# Patient Record
Sex: Male | Born: 1980 | Race: Black or African American | Hispanic: No | Marital: Single | Smoking: Current every day smoker
Health system: Southern US, Community
[De-identification: ages and names within clinical notes are randomized; demographics above are authoritative.]

---

## 2007-01-17 ENCOUNTER — Emergency Department: Payer: Self-pay | Admitting: Unknown Physician Specialty

## 2010-04-25 ENCOUNTER — Emergency Department: Payer: Self-pay | Admitting: Emergency Medicine

## 2010-08-05 DEATH — deceased

## 2011-07-26 ENCOUNTER — Emergency Department: Payer: Self-pay | Admitting: Unknown Physician Specialty

## 2013-10-28 ENCOUNTER — Emergency Department: Payer: Self-pay | Admitting: Emergency Medicine

## 2014-03-17 ENCOUNTER — Observation Stay: Payer: Self-pay | Admitting: Student

## 2014-03-17 LAB — URINALYSIS, COMPLETE
BACTERIA: NONE SEEN
Bilirubin,UR: NEGATIVE
GLUCOSE, UR: NEGATIVE mg/dL (ref 0–75)
Ketone: NEGATIVE
NITRITE: NEGATIVE
Ph: 6 (ref 4.5–8.0)
Protein: 100
RBC,UR: 6 /HPF (ref 0–5)
SPECIFIC GRAVITY: 1.028 (ref 1.003–1.030)
WBC UR: 24 /HPF (ref 0–5)

## 2014-03-17 LAB — CBC WITH DIFFERENTIAL/PLATELET
BASOS PCT: 0.6 %
Basophil #: 0.1 10*3/uL (ref 0.0–0.1)
EOS ABS: 0 10*3/uL (ref 0.0–0.7)
Eosinophil %: 0.1 %
HCT: 47.1 % (ref 40.0–52.0)
HGB: 15.6 g/dL (ref 13.0–18.0)
Lymphocyte #: 1.1 10*3/uL (ref 1.0–3.6)
Lymphocyte %: 7.1 %
MCH: 27.2 pg (ref 26.0–34.0)
MCHC: 33 g/dL (ref 32.0–36.0)
MCV: 82 fL (ref 80–100)
MONO ABS: 0.6 x10 3/mm (ref 0.2–1.0)
Monocyte %: 3.8 %
NEUTROS PCT: 88.4 %
Neutrophil #: 14.3 10*3/uL — ABNORMAL HIGH (ref 1.4–6.5)
Platelet: 190 10*3/uL (ref 150–440)
RBC: 5.72 10*6/uL (ref 4.40–5.90)
RDW: 14 % (ref 11.5–14.5)
WBC: 16.1 10*3/uL — ABNORMAL HIGH (ref 3.8–10.6)

## 2014-03-17 LAB — COMPREHENSIVE METABOLIC PANEL
ALT: 15 U/L (ref 12–78)
Albumin: 3.2 g/dL — ABNORMAL LOW (ref 3.4–5.0)
Alkaline Phosphatase: 42 U/L — ABNORMAL LOW
Anion Gap: 7 (ref 7–16)
BILIRUBIN TOTAL: 0.4 mg/dL (ref 0.2–1.0)
BUN: 8 mg/dL (ref 7–18)
CHLORIDE: 96 mmol/L — AB (ref 98–107)
CO2: 28 mmol/L (ref 21–32)
Calcium, Total: 9 mg/dL (ref 8.5–10.1)
Creatinine: 1.06 mg/dL (ref 0.60–1.30)
EGFR (African American): >60
EGFR (Non-African Amer.): >60
Glucose: 124 mg/dL — ABNORMAL HIGH (ref 65–99)
OSMOLALITY: 262 (ref 275–301)
POTASSIUM: 3.7 mmol/L (ref 3.5–5.1)
SGOT(AST): 14 U/L — ABNORMAL LOW (ref 15–37)
SODIUM: 131 mmol/L — AB (ref 136–145)
Total Protein: 8.1 g/dL (ref 6.4–8.2)

## 2014-03-18 LAB — CBC WITH DIFFERENTIAL/PLATELET
Basophil #: 0 10*3/uL (ref 0.0–0.1)
Basophil %: 0.1 %
EOS PCT: 0 %
Eosinophil #: 0 10*3/uL (ref 0.0–0.7)
HCT: 44 % (ref 40.0–52.0)
HGB: 15 g/dL (ref 13.0–18.0)
LYMPHS PCT: 2.1 %
Lymphocyte #: 0.3 10*3/uL — ABNORMAL LOW (ref 1.0–3.6)
MCH: 27.8 pg (ref 26.0–34.0)
MCHC: 34.2 g/dL (ref 32.0–36.0)
MCV: 81 fL (ref 80–100)
Monocyte #: 0.2 x10 3/mm (ref 0.2–1.0)
Monocyte %: 1.7 %
Neutrophil #: 14.1 10*3/uL — ABNORMAL HIGH (ref 1.4–6.5)
Neutrophil %: 96.1 %
Platelet: 197 10*3/uL (ref 150–440)
RBC: 5.41 10*6/uL (ref 4.40–5.90)
RDW: 14.2 % (ref 11.5–14.5)
WBC: 14.6 10*3/uL — AB (ref 3.8–10.6)

## 2014-03-18 LAB — BASIC METABOLIC PANEL
ANION GAP: 7 (ref 7–16)
BUN: 8 mg/dL (ref 7–18)
CALCIUM: 8.3 mg/dL — AB (ref 8.5–10.1)
Chloride: 101 mmol/L (ref 98–107)
Co2: 24 mmol/L (ref 21–32)
Creatinine: 1.08 mg/dL (ref 0.60–1.30)
EGFR (African American): >60
Glucose: 153 mg/dL — ABNORMAL HIGH (ref 65–99)
Osmolality: 266 (ref 275–301)
POTASSIUM: 4 mmol/L (ref 3.5–5.1)
Sodium: 132 mmol/L — ABNORMAL LOW (ref 136–145)

## 2014-03-22 LAB — CULTURE, BLOOD (SINGLE)

## 2015-01-26 NOTE — H&P (Signed)
PATIENT NAME:  Aaron Noble, Aaron Noble MR#:  [id number] DATE OF BIRTH:  September 06, 1981  DATE OF ADMISSION:  03/17/2014  PRIMARY CARE PHYSICIAN: None.   REFERRING PHYSICIAN: Dr. Lowella FairyJohn Woodruff.   CHIEF COMPLAINT: Back pain.   HISTORY OF PRESENT ILLNESS: Mr. Aaron Noble is a 34 year old male with a history of obesity, continued tobacco use, presented to the Emergency Department with complaints of back pain. The patient has been experiencing mild cough with no productive sputum. Denies having any shortness of breath. The patient has been having a fever of 101.   WORKUP IN THE EMERGENCY DEPARTMENT: Chest x-ray shows left lower lobe pneumonia. The patient received Rocephin and Zithromax in the Emergency Department. The patient is also found to have elevated white blood cell count of 16,000 with a left shift of 88%. The patient has mild hyponatremia of 131. The patient also has urinary tract infection with 1+ leukocyte esterase and WBC of 24. The patient states continues to smoke tobacco, uses cocaine on a regular basis, last use was 2 days back.   PAST MEDICAL HISTORY: None.   PAST SURGICAL HISTORY: None.   ALLERGIES: No known drug allergies.   HOME MEDICATIONS: Ibuprofen 400 mg every 6 hours as needed.   SOCIAL HISTORY:  1. Continues to smoke 10 cigarettes a day.  2. Drinks 2 beers daily.  3. Uses cocaine on a regular basis.  4. Works as a [employer].   FAMILY HISTORY: Mother had diabetes mellitus and strokes, died in her 2950s.   REVIEW OF SYSTEMS:  CONSTITUTIONAL: Experiencing generalized weakness.  EYES: No change in vision.  ENT: No change in hearing.  RESPIRATORY: Has mild shortness of breath.  CARDIOVASCULAR: No chest pain, palpations.  GASTROINTESTINAL: No nausea, vomiting, abdominal pain.  GENITOURINARY: No dysuria or hematuria.  SKIN: No rash or lesions.  MUSCULOSKELETAL: Has back pain.  NEUROLOGIC: No weakness or numbness in any part of the body.   PHYSICAL EXAMINATION:  GENERAL: This is a  little bit well-built, well-nourished, obese male lying down in the bed, not in distress.  VITAL SIGNS: Temperature 101.2, pulse 104, blood pressure 129/72, respiratory rate of 18, oxygen saturation 97% on room air.  HEENT: Head normocephalic, atraumatic. There is no scleral icterus. Conjunctivae normal. Pupils equal and react to light. Extraocular movements are intact. Mucous membranes moist. No pharyngeal erythema.  NECK: Supple. No lymphadenopathy. No JVD. No carotid bruit. No thyromegaly.  CHEST: Has no focal tenderness, bilateral coarse breath sounds.  HEART: S1, S2, regular, tachycardia.  ABDOMEN: Bowel sounds present. Soft, nontender, nondistended. Obese abdomen. Could not appreciate any hepatosplenomegaly.  EXTREMITIES: No pedal edema. Pulses 2+.  SKIN: No rash or lesions.  MUSCULOSKELETAL: Good range of motion in all the extremities.  NEUROLOGIC: The patient is alert, oriented to place, person, and time. Cranial nerves II-XII intact. Motor 5/5 in upper and lower extremities.   LABORATORY DATA: CBC, WBC of 16,000, hemoglobin 15.6, platelet count of 190,000, with a left shift of 88%. CMP: BUN 8, creatinine of 1.06, sodium 131. Urinalysis 1+ leukocyte esterase, WBC of 24. The rest of the values are within normal limits.   Chest x-ray PA and lateral: Left lower lobe pneumonia.   ASSESSMENT AND PLAN: Mr. Aaron Noble is a 34 year old male who comes with left lower lobe pneumonia. We are going to treat it as a community-acquired pneumonia with Rocephin and Zithromax. Follow up with blood and sputum cultures. 1. Urinary tract infection. Obtain urine cultures. Continue with Rocephin.  2. Sepsis secondary to both pneumonia and  the urinary tract infection. Continue with the current antibiotic regimen.  3. Tobacco use. Counseled with the patient. Patient expressed understanding.  4. Continued alcohol use. Keep the patient on thiamine and a Foley cath in. Will have a close followup if the patient develops  any signs of alcohol withdrawal.  5. Obesity. Counseled with the patient regarding diet and exercise. Expressed understanding.  6. Keep the patient on deep vein thrombosis prophylaxis with Lovenox.   TIME SPENT: 50 minutes.    ____________________________ Susa Griffins, MD pv:lt D: 03/17/2014 23:28:27 ET T: 03/18/2014 02:45:00 ET JOB#: [id number]  cc: Susa Griffins, MD, <Dictator> Susa Griffins MD ELECTRONICALLY SIGNED 03/21/2014 7:20

## 2015-01-26 NOTE — Discharge Summary (Signed)
PATIENT NAME:  Aaron Noble, Aaron Noble MR#:  [id number] DATE OF BIRTH:  Oct 01, 1981  DATE OF ADMISSION:  03/17/2014 DATE OF DISCHARGE:  03/19/2014  PRIMARY CARE PHYSICIAN: None.  CHIEF COMPLAINT: Back pain.   DISCHARGE DIAGNOSES:  1. Sepsis due to pneumonia and possible urinary tract infection.  2. Tobacco abuse.  3. Alcohol abuse.  4. Obesity.   DISCHARGE MEDICATIONS: Vantin 200 mg every 12 hours for 6 days.   DIET: Regular.   ACTIVITY: As tolerated.   FOLLOWUP: Please follow with PCP within 1- 2 weeks. If shortness of breath, cough, wheezing worsens or you have recurrent fevers, follow with your doctor right away.   DISPOSITION: Home.   SIGNIFICANT LABORATORIES AND IMAGING: Initial white count of 16.1. Blood cultures from June 13th: No growth to date. Urinalysis on arrival showed 1+ leukocyte esterase, 6 RBCs, 24 WBC. No bacteria. X-ray of the chest, PA and lateral: Left lower lobe pneumonia.   HISTORY OF PRESENT ILLNESS AND HOSPITAL COURSE: For full details of H and P, please see the dictation on June 13th by Dr. Heron NayVasireddy but briefly, this is a 34 year old male with tobacco abuse, who came in with fevers and leukocytosis. He was admitted to the hospitalist service, started on ceftriaxone and azithromycin for sepsis, which was deemed to be secondary to possible UTI as he did have dirty urine as well as pneumonia. He had mild leukocytosis and fever. He did well while in the hospital. He at this point has received a couple of days of IV antibiotics and will be discharged with 6 days of Vantin. Unfortunately, there were no urine cultures sent, but the UA was definitely positive. He was started on some fluids. At this point, his shortness of breath is significantly better. He was also started on some steroids for the wheezing that he had likely secondary to the pneumonia. He is an active smoker and he was counseled against smoking. He was also counseled against drinking alcohol. He did not have  significant withdrawal symptoms from alcohol. He had a mild hyponatremia on admission of 131 but we have no previous recent labs so do not know if this is acute or chronic but following sodium on 06/14 was 132. It is possible this is chronic, but this is not certain. At this point, he is walking in the halls. He has no fever.   PHYSICAL EXAMINATION:  VITAL SIGNS: Today is 98.1 temperature, pulse rate 70, respiratory rate 18, blood pressure 106/67, O2 saturation 98% on room air.  GENERAL: The patient is a well-developed obese male lying in bed, no obvious distress.  HEENT: Normocephalic, atraumatic. Pupils are equal. LUNGS: Patient has mild rales on the right but good air entry without significant wheezing or rhonchi.  CARDIAC: Normal S1 and S2. EXTREMITIES: Legs: No edema.   At this point he will be discharged to outpatient followup.   TIME SPENT: Total time spent is 35 minutes.   CODE STATUS: The patient is full code.     ____________________________ Krystal EatonShayiq Maylea Soria, MD sa:lt D: 03/19/2014 17:47:19 ET T: 03/19/2014 20:31:38 ET JOB#: [id number]  cc: Krystal EatonShayiq Morgin Halls, MD, <Dictator> Krystal EatonSHAYIQ Jaydyn Menon MD ELECTRONICALLY SIGNED 04/03/2014 12:51

## 2016-07-25 ENCOUNTER — Encounter: Payer: Self-pay | Admitting: Emergency Medicine

## 2016-07-25 ENCOUNTER — Emergency Department
Admission: EM | Admit: 2016-07-25 | Discharge: 2016-07-25 | Disposition: A | Discharge status: Home / Self Care | Payer: No Typology Code available for payment source | Attending: Emergency Medicine | Admitting: Emergency Medicine

## 2016-07-25 ENCOUNTER — Emergency Department: Payer: No Typology Code available for payment source

## 2016-07-25 DIAGNOSIS — Y92410 Unspecified street and highway as the place of occurrence of the external cause: Secondary | ICD-10-CM | POA: Insufficient documentation

## 2016-07-25 DIAGNOSIS — Y9389 Activity, other specified: Secondary | ICD-10-CM | POA: Insufficient documentation

## 2016-07-25 DIAGNOSIS — S39012A Strain of muscle, fascia and tendon of lower back, initial encounter: Secondary | ICD-10-CM | POA: Insufficient documentation

## 2016-07-25 DIAGNOSIS — Y999 Unspecified external cause status: Secondary | ICD-10-CM | POA: Insufficient documentation

## 2016-07-25 DIAGNOSIS — S3992XA Unspecified injury of lower back, initial encounter: Secondary | ICD-10-CM | POA: Diagnosis present

## 2016-07-25 MED ORDER — DIAZEPAM 5 MG PO TABS: 5.0000 mg | ORAL_TABLET | Freq: Three times a day (TID) | ORAL | 0 refills | Status: AC | PRN

## 2016-07-25 MED ORDER — IBUPROFEN 800 MG PO TABS: 800.0000 mg | ORAL_TABLET | Freq: Three times a day (TID) | ORAL | 0 refills | Status: AC | PRN
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
## 2021-05-01 ENCOUNTER — Emergency Department: Payer: No Typology Code available for payment source

## 2021-05-01 ENCOUNTER — Emergency Department
Admission: EM | Admit: 2021-05-01 | Discharge: 2021-05-01 | Disposition: A | Discharge status: Home / Self Care | Payer: No Typology Code available for payment source | Attending: Emergency Medicine | Admitting: Emergency Medicine

## 2021-05-01 DIAGNOSIS — M545 Low back pain, unspecified: Secondary | ICD-10-CM | POA: Diagnosis not present

## 2021-05-01 DIAGNOSIS — Y92410 Unspecified street and highway as the place of occurrence of the external cause: Secondary | ICD-10-CM | POA: Insufficient documentation

## 2021-05-01 DIAGNOSIS — F17210 Nicotine dependence, cigarettes, uncomplicated: Secondary | ICD-10-CM | POA: Diagnosis not present

## 2021-05-01 MED ORDER — ACETAMINOPHEN 325 MG PO TABS
650.0000 mg | ORAL_TABLET | Freq: Once | ORAL | Status: AC
  Administered 2021-05-01: 650 mg via ORAL
  Filled 2021-05-01: qty 2

## 2021-05-01 MED ORDER — METHOCARBAMOL 750 MG PO TABS: 750.0000 mg | ORAL_TABLET | Freq: Four times a day (QID) | ORAL | 0 refills | Status: AC | PRN

## 2021-05-01 MED ORDER — METHOCARBAMOL 500 MG PO TABS
750.0000 mg | ORAL_TABLET | Freq: Once | ORAL | Status: AC
  Administered 2021-05-01: 750 mg via ORAL
  Filled 2021-05-01: qty 2

## 2021-05-01 MED ORDER — MELOXICAM 15 MG PO TABS: 15.0000 mg | ORAL_TABLET | Freq: Every day | ORAL | 0 refills | Status: AC

## 2021-05-01 MED ORDER — MELOXICAM 7.5 MG PO TABS
15.0000 mg | ORAL_TABLET | Freq: Once | ORAL | Status: AC
  Administered 2021-05-01: 15 mg via ORAL
  Filled 2021-05-01: qty 2
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
## 2021-10-12 IMAGING — CR DG LUMBAR SPINE 2-3V
1 series · 3 of 3 positions shown · non-contrast
Comparison: Lumbar spine radiograph dated 07/25/2016.

CLINICAL DATA: 40-year-old male with low back pain.

EXAM:
LUMBAR SPINE - 2-3 VIEW

[Series 1: dg lumbar spine 2-3 views · 0.14mm/px · 3 of 3 slices shown]
[im 1/3]
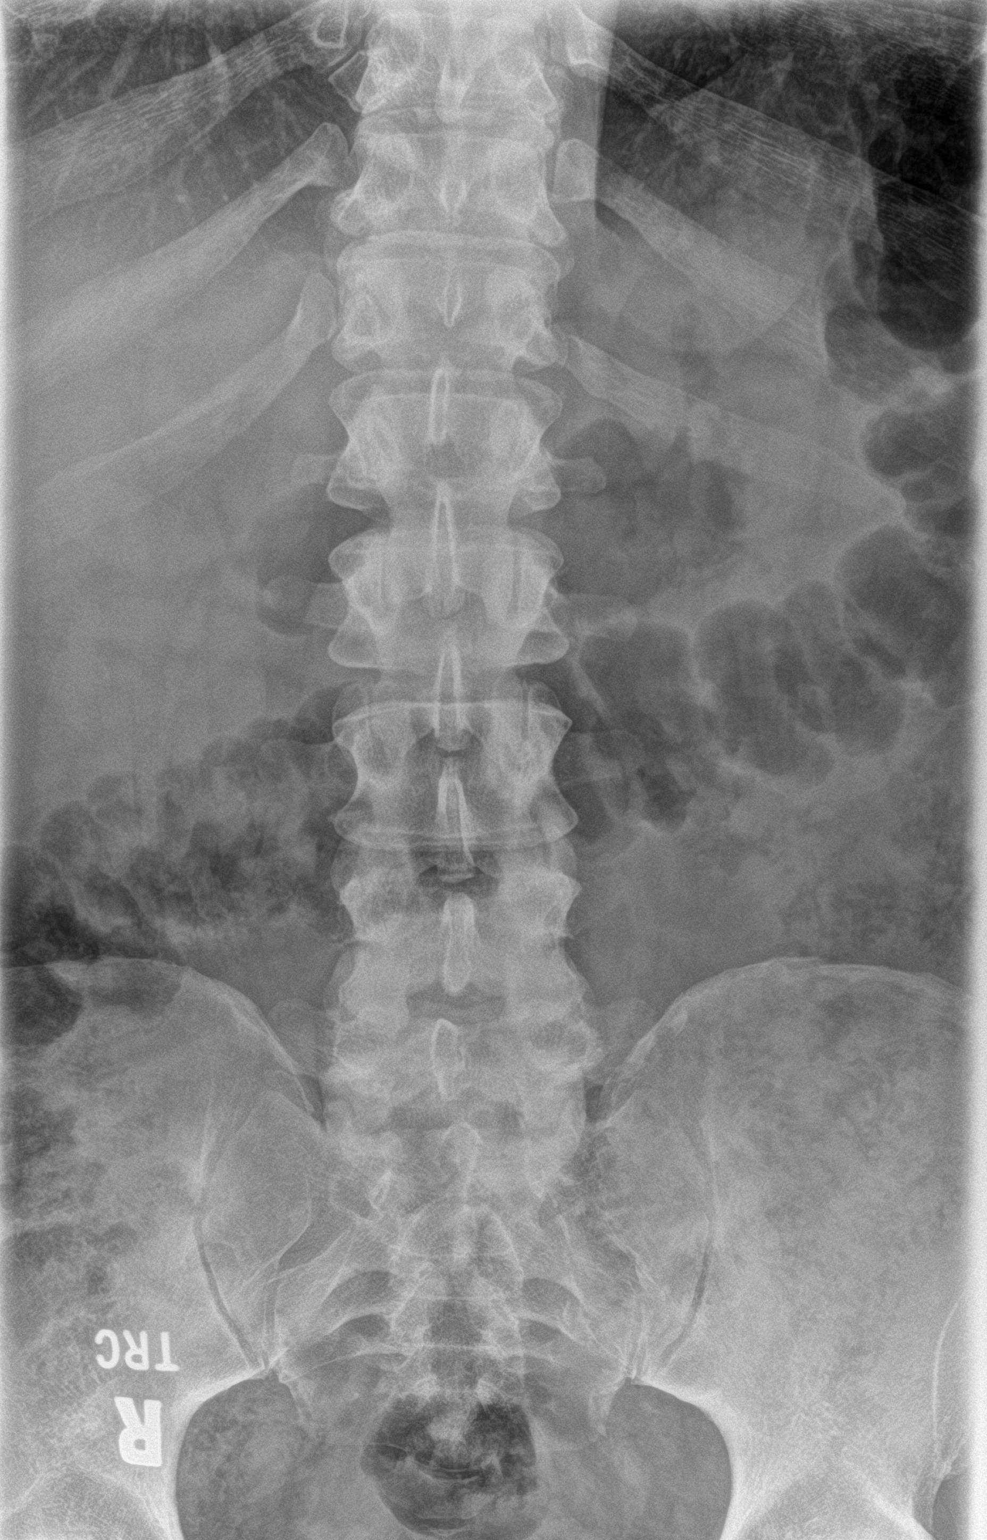
[im 2/3]
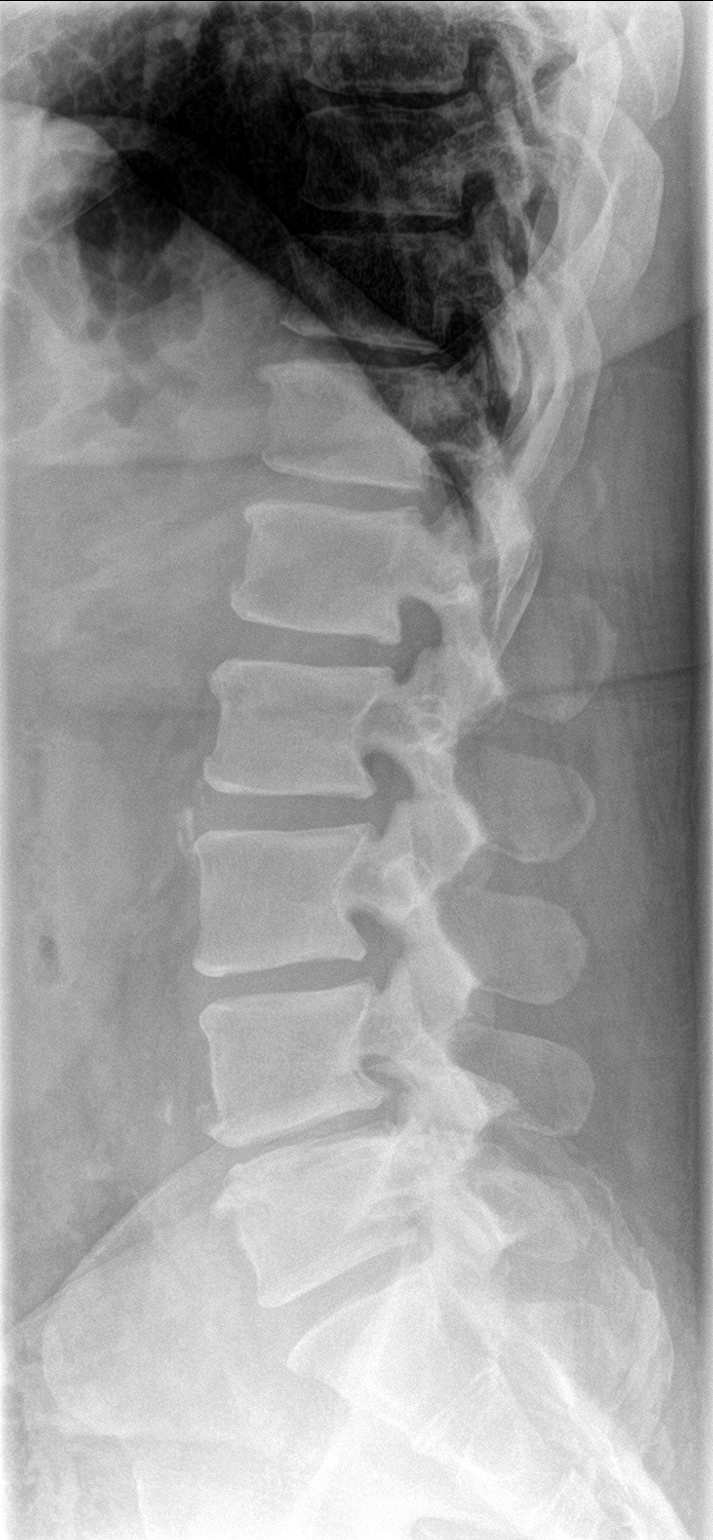
[im 3/3]
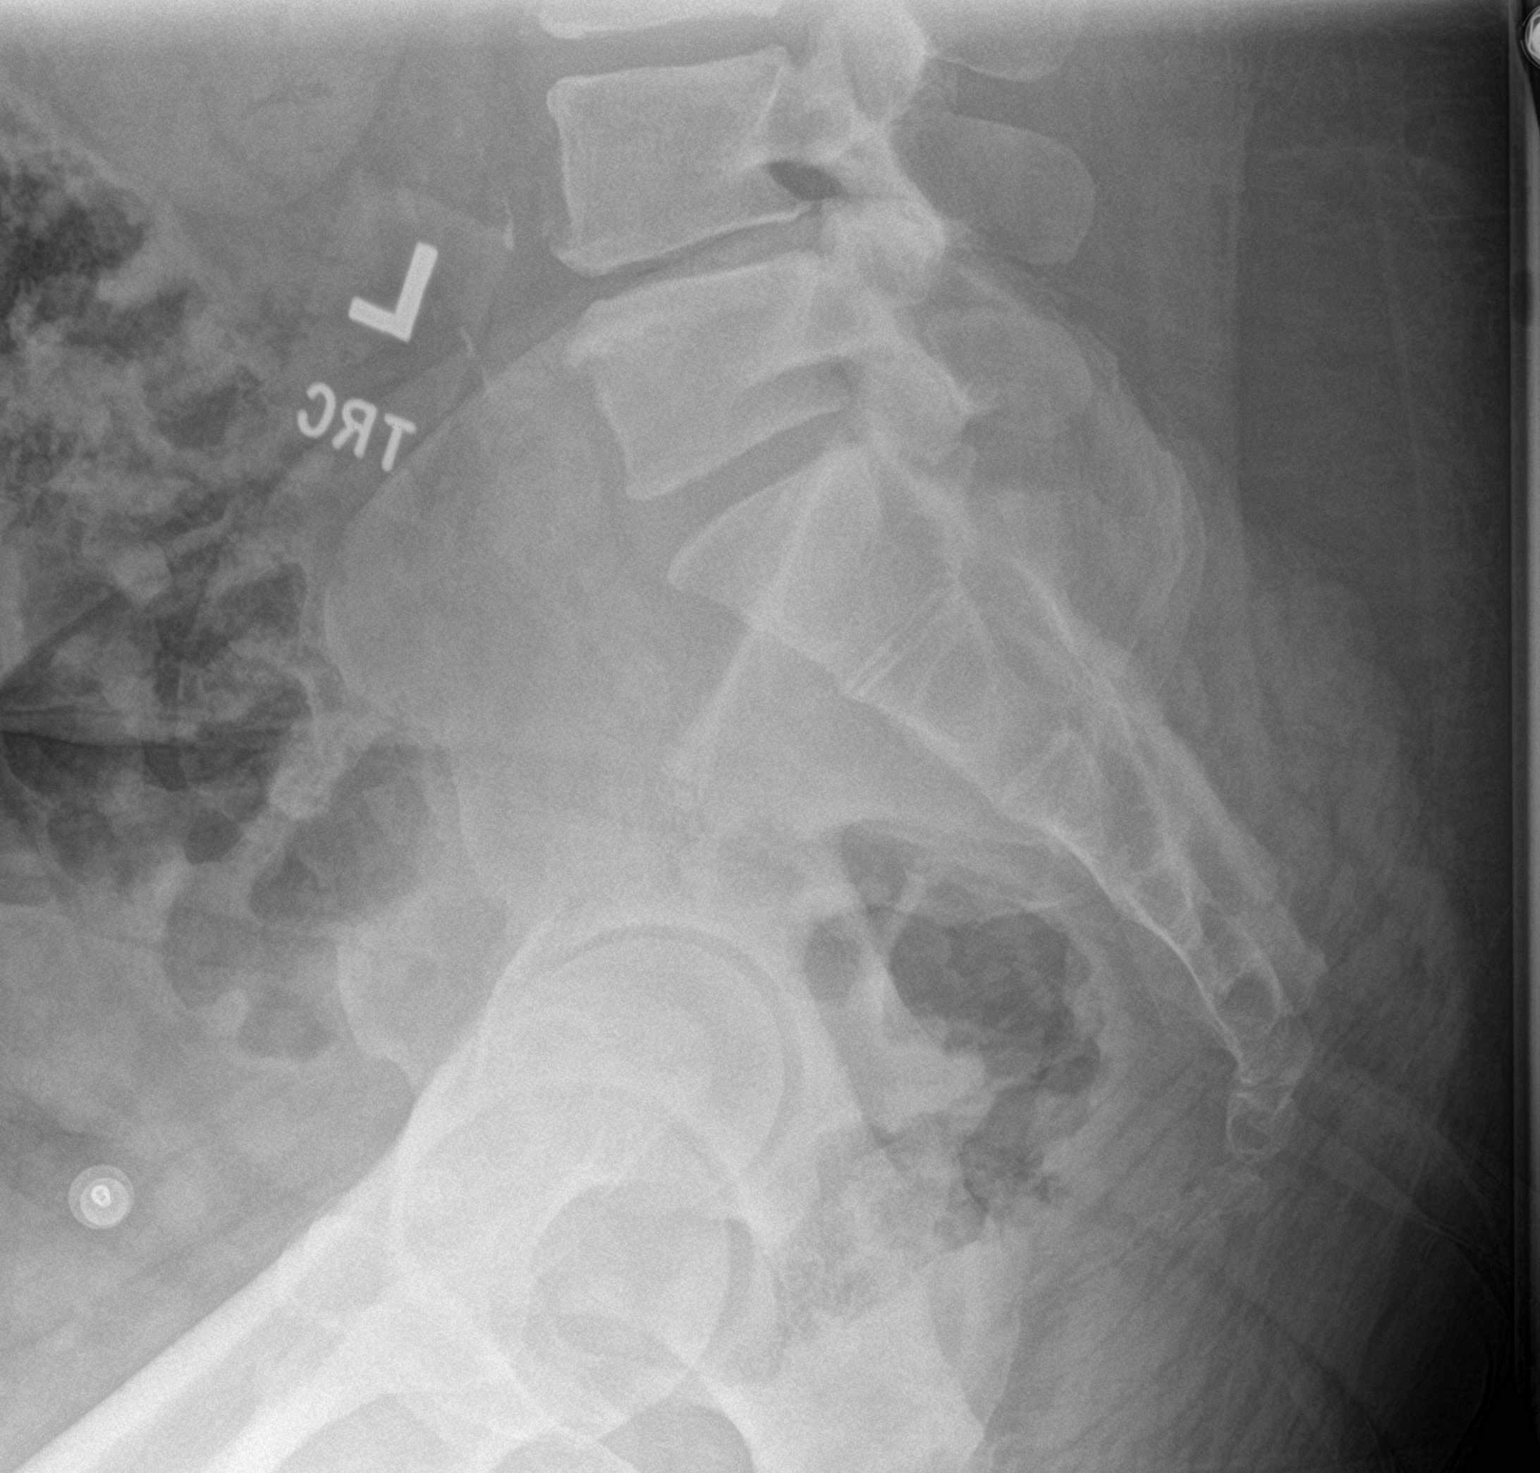

[3 of 3 positions shown; findings below may reference images not displayed]

FINDINGS: Five lumbar type vertebra. There is no acute fracture or subluxation
of the lumbar spine. Multilevel degenerative changes most prominent
at L4-L5 and L5-S1 with disc space narrowing and spurring. Lower
lumbar facet arthropathy. The visualized posterior elements are
intact. The soft tissues are unremarkable. There is mild
atherosclerotic calcification of the abdominal aorta.
IMPRESSION: 1. No acute fracture or subluxation of the lumbar spine.
2. Multilevel degenerative changes.

## 2022-02-10 ENCOUNTER — Other Ambulatory Visit: Payer: Self-pay

## 2022-02-10 ENCOUNTER — Emergency Department: Payer: Self-pay

## 2022-02-10 DIAGNOSIS — S60011A Contusion of right thumb without damage to nail, initial encounter: Secondary | ICD-10-CM | POA: Insufficient documentation

## 2022-02-10 DIAGNOSIS — Y990 Civilian activity done for income or pay: Secondary | ICD-10-CM | POA: Insufficient documentation

## 2022-02-10 DIAGNOSIS — W231XXA Caught, crushed, jammed, or pinched between stationary objects, initial encounter: Secondary | ICD-10-CM | POA: Insufficient documentation
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
## 2022-02-11 ENCOUNTER — Emergency Department
Admission: EM | Admit: 2022-02-11 | Discharge: 2022-02-11 | Disposition: A | Discharge status: Home / Self Care | Payer: Self-pay | Attending: Emergency Medicine | Admitting: Emergency Medicine

## 2022-02-11 DIAGNOSIS — S6010XA Contusion of unspecified finger with damage to nail, initial encounter: Secondary | ICD-10-CM

## 2022-02-11 NOTE — ED Provider Notes (Signed)
? ?[facility] ?Provider Note ? ? ? Event Date/Time  ? First MD Initiated Contact with Patient 02/11/22 0122   ?  (approximate) ? ? ?History  ? ?Extremity Pain ? ? ?HPI ? ?Aaron Noble is a 41 y.o. male who presents to the ED for evaluation of Extremity Pain ? ? ?Accidentally struck his right thumb distally while at work.  Reports this was on Friday, 3 to 4 days ago.  He works Government social research officer.  Increasing blood accumulation beneath the nail of the right thumb.  No further injury since then.  Due to increasing pain he presents to the ED. ? ?Physical Exam  ? ?Triage Vital Signs: ?ED Triage Vitals  ?Enc Vitals Group  ?   BP 02/10/22 2042 138/78  ?   Pulse Rate 02/10/22 2042 (!) 106  ?   Resp 02/10/22 2042 18  ?   Temp 02/10/22 2042 98.7 ?F (37.1 ?C)  ?   Temp Source 02/10/22 2042 Oral  ?   SpO2 02/10/22 2042 99 %  ?   Weight 02/10/22 2043 280 lb (127 kg)  ?   Height 02/10/22 2043 6\' 1"  (1.854 m)  ?   Head Circumference --   ?   Peak Flow --   ?   Pain Score 02/10/22 2043 5  ?   Pain Loc --   ?   Pain Edu? --   ?   Excl. in GC? --   ? ? ?Most recent vital signs: ?Vitals:  ? 02/10/22 2042  ?BP: 138/78  ?Pulse: (!) 106  ?Resp: 18  ?Temp: 98.7 ?F (37.1 ?C)  ?SpO2: 99%  ? ? ?General: Awake, no distress.  ?CV:  Good peripheral perfusion.  ?Resp:  Normal effort.  ?Abd:  No distention.  ?MSK:  Subungual hematoma beneath the right thumb nail.  Nail still opposed to the nailbed.  Covers greater than 90% of the nail area.  Full range of motion of the finger, hand.  No other signs of trauma. ?Neuro:  No focal deficits appreciated. ?Other:   ? ? ?ED Results / Procedures / Treatments  ? ?Labs ?(all labs ordered are listed, but only abnormal results are displayed) ?Labs Reviewed - No data to display ? ?EKG ? ? ?RADIOLOGY ?Plain film of the right thumb interpreted by me without evidence of fracture or dislocation ? ?Official radiology report(s): ?DG Finger Thumb Right ? ?Result Date: 02/10/2022 ?CLINICAL  DATA:  Right thumb injury EXAM: RIGHT THUMB 2+V COMPARISON:  None Available. FINDINGS: There is no evidence of fracture or dislocation. There is no evidence of arthropathy or other focal bone abnormality. Soft tissues are unremarkable. IMPRESSION: Negative. Electronically Signed   By: 04/12/2022 M.D.   On: 02/10/2022 21:05   ? ?PROCEDURES and INTERVENTIONS: ? ?04/12/2022.Incision and Drainage ? ?Date/Time: 02/11/2022 1:48 AM ?Performed by: 04/13/2022, MD ?Authorized by: Delton Prairie, MD  ? ?Consent:  ?  Consent obtained:  Verbal ?  Consent given by:  Patient ?  Risks, benefits, and alternatives were discussed: yes   ?  Risks discussed:  Bleeding, incomplete drainage and pain ?Location:  ?  Type:  Subungual hematoma ?  Size:  2 ?  Location:  Upper extremity ?  Upper extremity location: Right thumb nail. ?Procedure details:  ?  Incision types:  Stab incision ?Post-procedure details:  ?  Procedure completion:  Tolerated well, no immediate complications ?Comments:  ?   Portable electrocautery used for single stab incision to the midportion  of the right thumb nail with immediate release of dark blood, but tolerated with immediate improvement of pain. ? ?Medications - No data to display ? ? ?IMPRESSION / MDM / ASSESSMENT AND PLAN / ED COURSE  ?I reviewed the triage vital signs and the nursing notes. ? ?Pleasant 41 year old male presents to the ED after accidental injury to his right thumb nail a few days ago with an increasing subungual hematoma suitable for bedside trephination and outpatient management.  He look systemically well overall with no other signs of trauma beyond his right thumb.  X-ray without evidence of fracture or dislocation beneath this.  Does have a large subungual hematoma.  Considered removal of the nail, but we decided upon trial of trephination first.  Performed by me with immediate release of dark blood and improvement of pain.  No barriers to outpatient management.  We discussed return  precautions. ? ?  ? ? ?FINAL CLINICAL IMPRESSION(S) / ED DIAGNOSES  ? ?Final diagnoses:  ?Subungual hematoma of finger of right hand, initial encounter  ? ? ? ?Rx / DC Orders  ? ?ED Discharge Orders   ? ? None  ? ?  ? ? ? ?Note:  This document was prepared using Dragon voice recognition software and may include unintentional dictation errors. ?  ?Delton Prairie, MD ?02/11/22 0149 ? ?

## 2022-02-11 NOTE — Discharge Instructions (Addendum)
Please take Tylenol and ibuprofen/Advil for your pain.  It is safe to take them together, or to alternate them every few hours.  Take up to 1000mg of Tylenol at a time, up to 4 times per day.  Do not take more than 4000 mg of Tylenol in 24 hours.  For ibuprofen, take 400-600 mg, 4-5 times per day. ° ° °

## 2022-07-21 ENCOUNTER — Other Ambulatory Visit: Payer: Self-pay

## 2022-07-21 ENCOUNTER — Emergency Department
Admission: EM | Admit: 2022-07-21 | Discharge: 2022-07-21 | Disposition: A | Discharge status: Home / Self Care | Payer: BC Managed Care – PPO | Attending: Student | Admitting: Student

## 2022-07-21 DIAGNOSIS — M5416 Radiculopathy, lumbar region: Secondary | ICD-10-CM | POA: Insufficient documentation

## 2022-07-21 DIAGNOSIS — M545 Low back pain, unspecified: Secondary | ICD-10-CM | POA: Diagnosis present

## 2022-07-21 MED ORDER — PREDNISONE 10 MG (21) PO TBPK: ORAL_TABLET | ORAL | 0 refills | Status: AC

## 2022-07-21 MED ORDER — LIDOCAINE 5 % EX PTCH: 1.0000 | MEDICATED_PATCH | Freq: Two times a day (BID) | CUTANEOUS | 0 refills | Status: AC

## 2022-07-21 MED ORDER — NAPROXEN 500 MG PO TABS: 500.0000 mg | ORAL_TABLET | Freq: Two times a day (BID) | ORAL | 0 refills | Status: AC

## 2022-07-21 NOTE — ED Triage Notes (Signed)
Pt in with co back pain states started after an injuury.

## 2022-07-21 NOTE — ED Provider Notes (Signed)
[facility] Provider Note    None    (approximate)   History   Back Pain   HPI  Aaron Noble is a 41 y.o. male with no reported past medical history presents today for evaluation of right-sided back pain that radiates down the lateral aspect of his right leg.  Patient reports that this has been ongoing for 1 month.  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  He denies any weakness in his leg.  He has not had any urinary or fecal incontinence or retention.  He denies any major trauma.  He denies fevers or chills.  No history of IV drug use.  No saddle anesthesia.  He has not taken anything for his pain.     Physical Exam   Triage Vital Signs: ED Triage Vitals [07/21/22 1027]  Enc Vitals Group     BP (!) 151/114     Pulse Rate 96     Resp 20     Temp 97.8 F (36.6 C)     Temp Source Oral     SpO2 98 %     Weight 250 lb (113.4 kg)     Height 6' (1.829 m)     Head Circumference      Peak Flow      Pain Score      Pain Loc      Pain Edu?      Excl. in GC?     Most recent vital signs: Vitals:   07/21/22 1027  BP: (!) 151/114  Pulse: 96  Resp: 20  Temp: 97.8 F (36.6 C)  SpO2: 98%    Physical Exam Vitals and nursing note reviewed.  Constitutional:      General: Awake and alert. No acute distress.    Appearance: Normal appearance. The patient is normal weight.  HENT:     Head: Normocephalic and atraumatic.     Mouth: Mucous membranes are moist.  Eyes:     General: PERRL. Normal EOMs        Right eye: No discharge.        Left eye: No discharge.     Conjunctiva/sclera: Conjunctivae normal.  Cardiovascular:     Rate and Rhythm: Normal rate and regular rhythm.     Pulses: Normal pulses.     Heart sounds: Normal heart sounds Pulmonary:     Effort: Pulmonary effort is normal. No respiratory distress.     Breath sounds: Normal breath sounds.  Abdominal:     Abdomen is  soft. There is no abdominal tenderness. No rebound or guarding. No distention. Musculoskeletal:        General: No swelling. Normal range of motion.     Cervical back: Normal range of motion and neck supple.  Back: No midline tenderness. Strength and sensation 5/5 to bilateral lower extremities. Normal great toe extension against resistance. Normal sensation throughout feet. Normal patellar reflexes. Pain with SLR and opposite SLR bilaterally.  Skin:    General: Skin is warm and dry.     Capillary Refill: Capillary refill takes less than 2 seconds.     Findings: No rash.  Neurological:     Mental Status: The patient is awake and alert.      ED Results / Procedures / Treatments   Labs (all labs ordered are listed, but only abnormal results are displayed) Labs Reviewed - No data to display  EKG     RADIOLOGY     PROCEDURES:  Critical Care performed:   Procedures   MEDICATIONS ORDERED IN ED: Medications - No data to display   IMPRESSION / MDM / [place] / ED COURSE  I reviewed the triage vital signs and the nursing notes.   Differential diagnosis includes, but is not limited to, back spasm, lumbar radiculopathy, sciatica, muscle injury.  Patient is awake and alert, hemodynamically stable and afebrile.  He is neurologically and neurovascularly intact.  He has 5 out of 5 strength with intact sensation to extensor hallucis dorsiflexion and plantarflexion of bilateral lower extremities with normal patellar reflexes bilaterally. Most likely etiology at this point is muscle strain vs herniated disc. No red flags to indicate patient is at risk for more auspicious process that would require urgent/emergent spinal imaging or subspecialty evaluation at this time. No major trauma, no midline tenderness, no history or physical exam findings to suggest cauda equina syndrome or spinal cord compression. No focal neurological deficits on exam. No constitutional symptoms or  history of immunosuppression or IVDA to suggest potential for epidural abscess. Not anticoagulated, no history of bleeding diastasis to suggest risk for epidural hematoma. No chronic steroid use or advanced age or history of malignancy to suggest proclivity towards pathological fracture.  No abdominal pain or flank pain to suggest kidney stone, no history of kidney stone.  No fever or dysuria or CVAT to suggest pyelonephritis .  No chest pain, back pain, shortness of breath, neurological deficits, to suggest vascular catastrophe, and pulses are equal in all 4 extremities.  Discussed care instructions and return precautions with patient. Recommended close outpatient follow-up for re-evaluation. Patient agrees with plan of care. Will treat the patient symptomatically as needed for pain control. Will discharge patient to take these medications and return for any worsening or different pain or development of any neurologic symptoms. Educated patient regarding expected time course for back pain to improve and recommended very close outpatient follow-up.   Patient's presentation is most consistent with acute illness / injury with system symptoms.       FINAL CLINICAL IMPRESSION(S) / ED DIAGNOSES   Final diagnoses:  Lumbar radiculopathy     Rx / DC Orders   ED Discharge Orders          Ordered    predniSONE (STERAPRED UNI-PAK 21 TAB) 10 MG (21) TBPK tablet        07/21/22 1040    naproxen (NAPROSYN) 500 MG tablet  2 times daily with meals        07/21/22 1040    lidocaine (LIDODERM) 5 %  Every 12 hours        07/21/22 1040             Note:  This document was prepared using Dragon voice recognition software and may include unintentional dictation errors.   Marquette Old, PA-C 07/21/22 1452    Merlyn Lot, MD 07/21/22 1454

## 2022-07-21 NOTE — Discharge Instructions (Signed)
Please take the medications as prescribed.  Please return to the emergency department for any new, worsening, or changing symptoms or other concerns including weakness in your legs, urinary or stool incontinence or retention, numbness or tingling in your extremities/buttocks/groin, fevers, or any other concerns or change in symptoms.

## 2022-07-21 NOTE — ED Triage Notes (Signed)
Pt presents with back pain.  

## 2022-07-21 NOTE — ED Notes (Signed)
D/C and new RX discussed with pt. Pt verbalized understanding. S/O with pt.

## 2022-07-24 IMAGING — CR DG FINGER THUMB 2+V*R*
1 series · 4 of 4 positions shown · non-contrast
Comparison: None Available.

CLINICAL DATA: Right thumb injury

EXAM:
RIGHT THUMB 2+V

[Series 1: dg finger thumb right · 0.14mm/px · 4 of 4 slices shown]
[im 1/4]
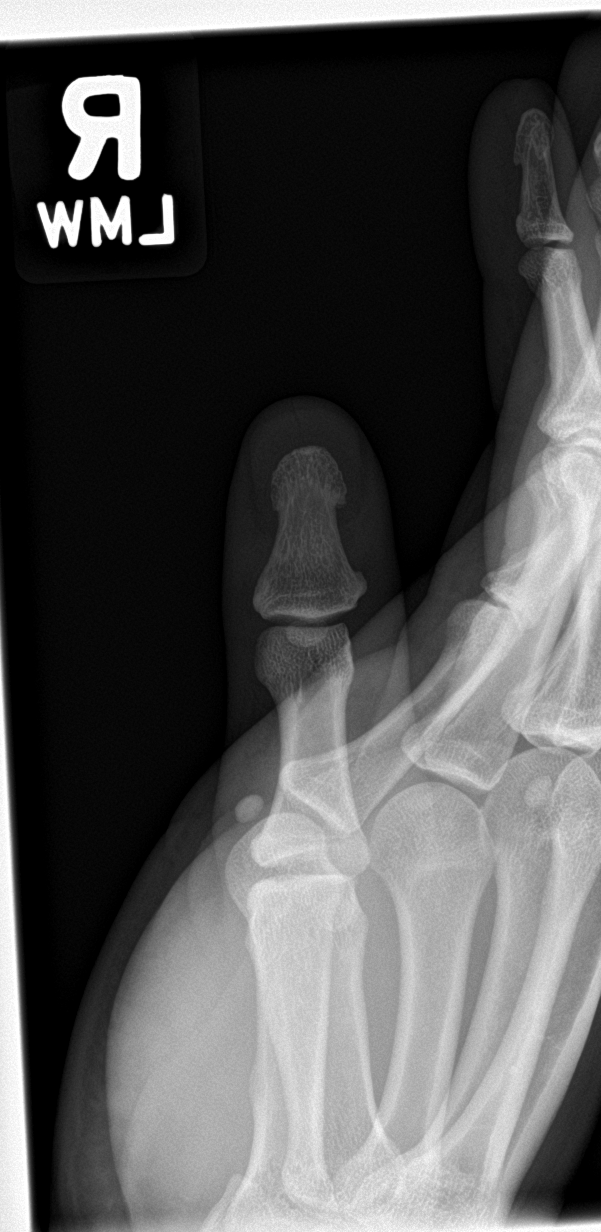
[im 2/4]
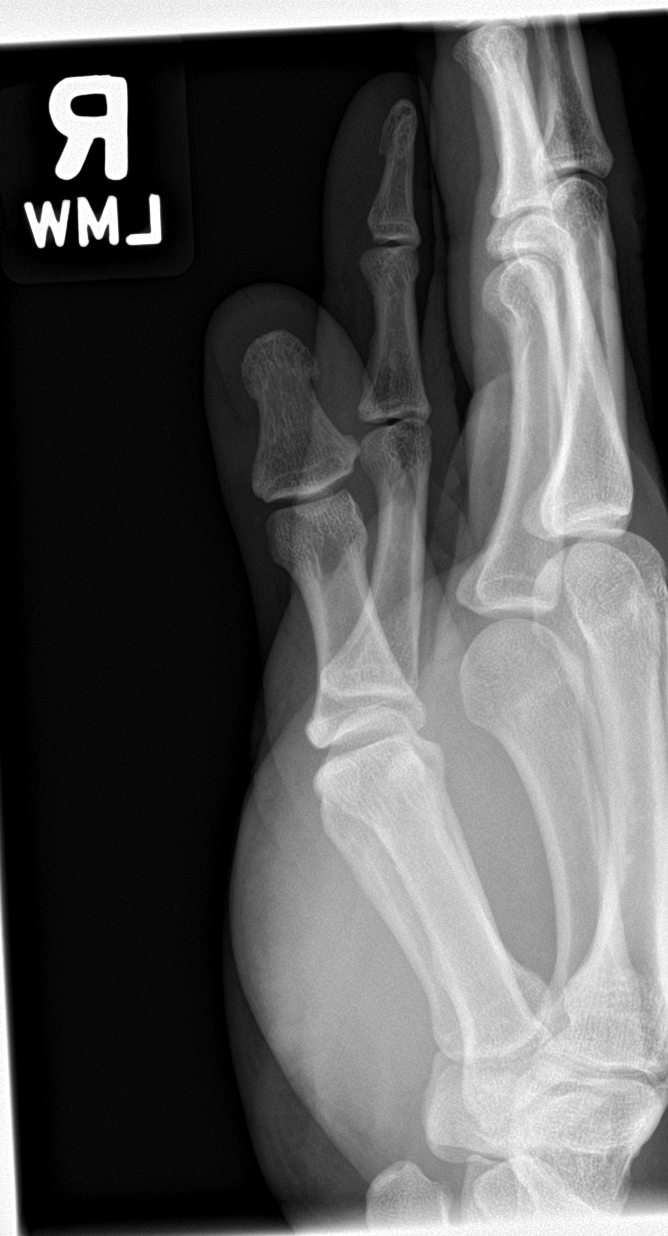
[im 3/4]
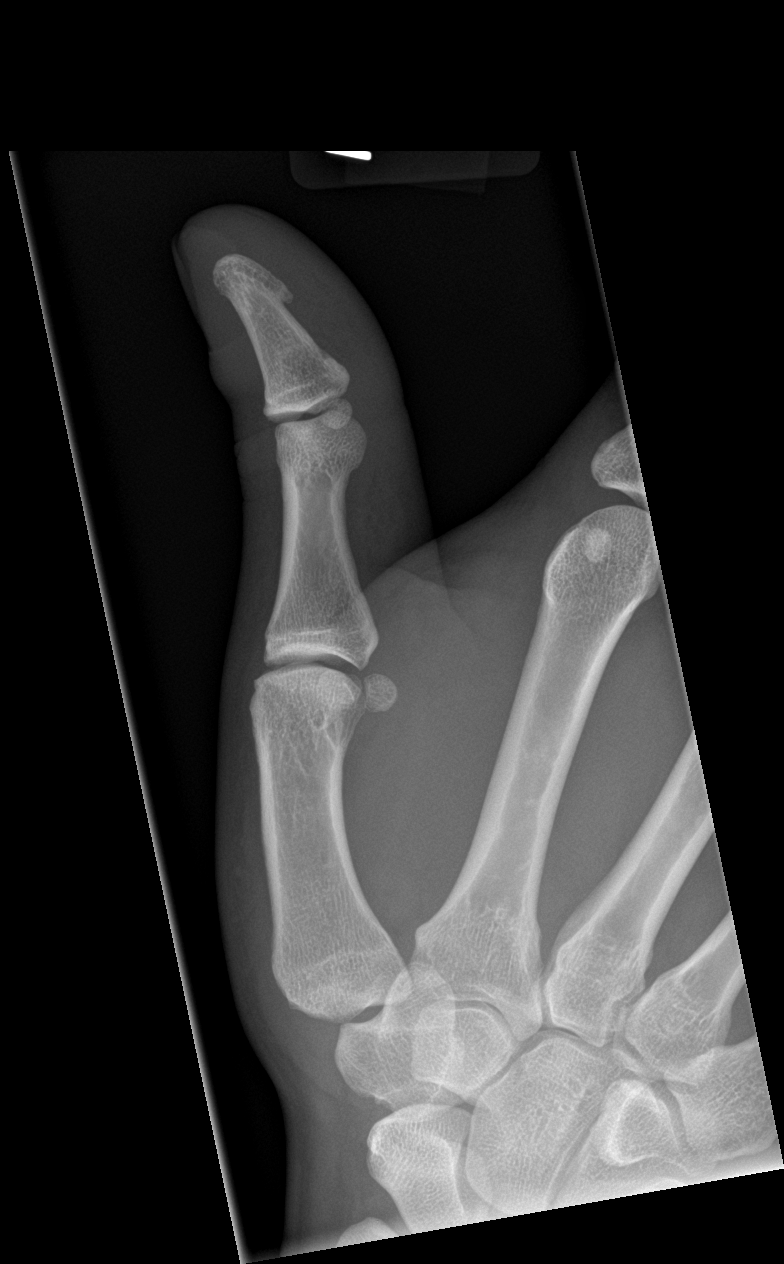
[im 4/4]
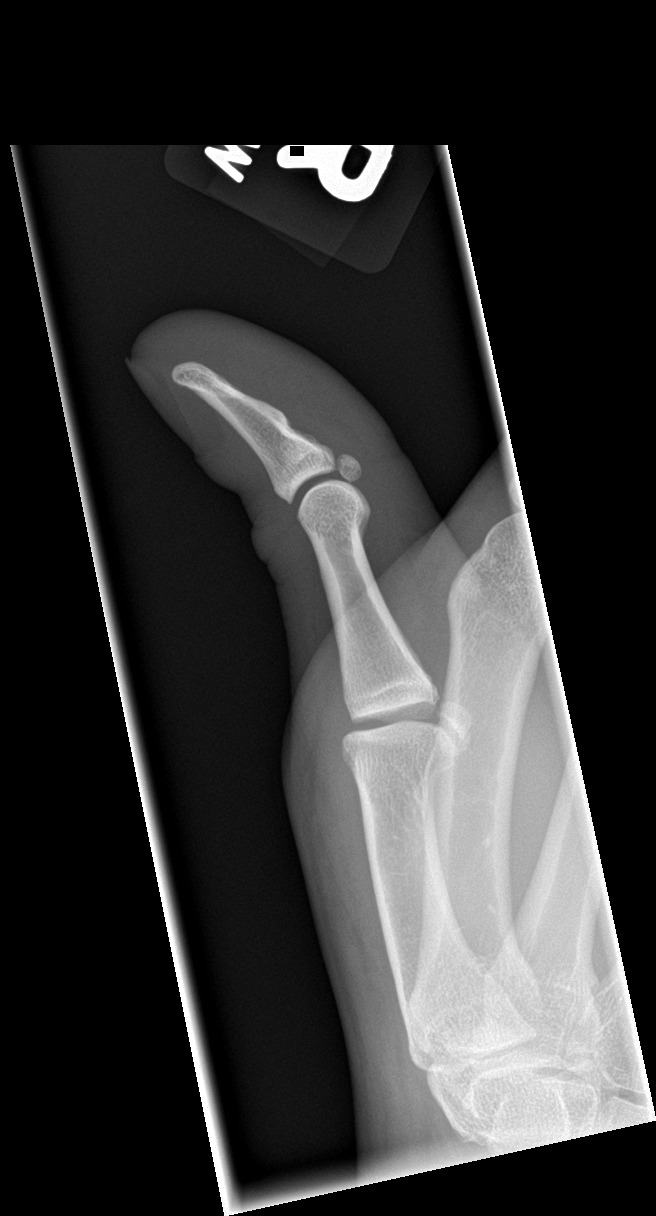

[4 of 4 positions shown; findings below may reference images not displayed]

FINDINGS: There is no evidence of fracture or dislocation. There is no
evidence of arthropathy or other focal bone abnormality. Soft
tissues are unremarkable.
IMPRESSION: Negative.

## 2023-03-26 ENCOUNTER — Emergency Department
Admission: EM | Admit: 2023-03-26 | Discharge: 2023-03-26 | Disposition: A | Discharge status: Home / Self Care | Payer: BC Managed Care – PPO | Attending: Student in an Organized Health Care Education/Training Program | Admitting: Student in an Organized Health Care Education/Training Program

## 2023-03-26 DIAGNOSIS — R42 Dizziness and giddiness: Secondary | ICD-10-CM

## 2023-03-26 DIAGNOSIS — R1110 Vomiting, unspecified: Secondary | ICD-10-CM | POA: Insufficient documentation

## 2023-03-26 DIAGNOSIS — R001 Bradycardia, unspecified: Secondary | ICD-10-CM | POA: Insufficient documentation

## 2023-03-26 DIAGNOSIS — R519 Headache, unspecified: Secondary | ICD-10-CM | POA: Insufficient documentation

## 2023-03-26 LAB — CBC
HCT: 45.6 % (ref 39.0–52.0)
Hemoglobin: 15.9 g/dL (ref 13.0–17.0)
MCH: 28.3 pg (ref 26.0–34.0)
MCHC: 34.9 g/dL (ref 30.0–36.0)
MCV: 81.3 fL (ref 80.0–100.0)
Platelets: 276 10*3/uL (ref 150–400)
RBC: 5.61 MIL/uL (ref 4.22–5.81)
RDW: 12.9 % (ref 11.5–15.5)
WBC: 7.4 10*3/uL (ref 4.0–10.5)
nRBC: 0 % (ref 0.0–0.2)

## 2023-03-26 LAB — BASIC METABOLIC PANEL
Anion gap: 8 (ref 5–15)
BUN: 11 mg/dL (ref 6–20)
CO2: 25 mmol/L (ref 22–32)
Calcium: 8.2 mg/dL — ABNORMAL LOW (ref 8.9–10.3)
Chloride: 103 mmol/L (ref 98–111)
Creatinine, Ser: 0.81 mg/dL (ref 0.61–1.24)
GFR, Estimated: >60 mL/min (ref >60)
Glucose, Bld: 149 mg/dL — ABNORMAL HIGH (ref 70–99)
Potassium: 3.8 mmol/L (ref 3.5–5.1)
Sodium: 136 mmol/L (ref 135–145)

## 2023-03-26 LAB — LIPASE, BLOOD: Lipase: 33 U/L (ref 11–51)

## 2023-03-26 LAB — HEPATIC FUNCTION PANEL
ALT: 19 U/L (ref 0–44)
AST: 18 U/L (ref 15–41)
Albumin: 3.6 g/dL (ref 3.5–5.0)
Alkaline Phosphatase: 38 U/L (ref 38–126)
Bilirubin, Direct: <0.1 mg/dL (ref 0.0–0.2)
Total Bilirubin: 0.6 mg/dL (ref 0.3–1.2)
Total Protein: 6.9 g/dL (ref 6.5–8.1)

## 2023-03-26 MED ORDER — SODIUM CHLORIDE 0.9 % IV BOLUS
1000.0000 mL | Freq: Once | INTRAVENOUS | Status: AC
  Administered 2023-03-26: 1000 mL via INTRAVENOUS

## 2023-03-26 NOTE — ED Provider Notes (Signed)
[facility] Provider Note  Patient Contact: 7:55 PM (approximate)   History   Dizziness   HPI  Aaron Noble is a 42 y.o. male with a largely unremarkable past medical history, presents to the emergency department with dizziness that occurs intermittently while at work.  Patient states that dizziness can be reproduced if he squats down to pick up a large object and picks it up.  He states that he tries to stay hydrated but is uncertain how much fluids he drinks during the day.  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  He reports that he has not had a lot of dizziness today but did have some mild dizziness while walking to the car.  He states that he has no associated headache.  He has not started any medications.  He denies falls or other mechanisms of trauma.  He states that dizziness is not reproduced with rapid changes in movement.  No weakness in the arms or legs.  No associated cough or diarrhea.  Patient reports that he had 1 episode of vomiting while at work.  No chest pain, chest tightness, abdominal pain or shortness of breath.      Physical Exam   Triage Vital Signs: ED Triage Vitals  Enc Vitals Group     BP 03/26/23 1719 (!) 153/116     Pulse Rate 03/26/23 1719 84     Resp 03/26/23 1719 17     Temp 03/26/23 1719 98 F (36.7 C)     Temp Source 03/26/23 1719 Oral     SpO2 03/26/23 1719 100 %     Weight 03/26/23 1716 260 lb (117.9 kg)     Height 03/26/23 1716 5\' 11"  (1.803 m)     Head Circumference --      Peak Flow --      Pain Score 03/26/23 1716 0     Pain Loc --      Pain Edu? --      Excl. in GC? --     Most recent vital signs: Vitals:   03/26/23 1946 03/26/23 2030  BP: (!) 158/101 (!) 128/105  Pulse: 67 61  Resp: 16 15  Temp:    SpO2: 100% 98%     General: Alert and in no acute distress. Eyes:  PERRL. EOMI. Head: No acute traumatic findings ENT:      Ears: TMs are pearly.       Nose: No congestion/rhinnorhea.       Mouth/Throat: Mucous membranes are moist. Neck: No stridor. No cervical spine tenderness to palpation. Cardiovascular:  Good peripheral perfusion Respiratory: Normal respiratory effort without tachypnea or retractions. Lungs CTAB. Good air entry to the bases with no decreased or absent breath sounds. Gastrointestinal: Bowel sounds 4 quadrants. Soft and nontender to palpation. No guarding or rigidity. No palpable masses. No distention. No CVA tenderness. Musculoskeletal: Full range of motion to all extremities.  Neurologic:  No gross focal neurologic deficits are appreciated.  Skin:   No rash noted    ED Results / Procedures / Treatments   Labs (all labs ordered are listed, but only abnormal results are displayed) Labs Reviewed  BASIC METABOLIC PANEL - Abnormal; Notable for the following components:      Result Value   Glucose, Bld 149 (*)    Calcium 8.2 (*)    All other components within normal limits  CBC  HEPATIC FUNCTION PANEL  LIPASE, BLOOD     EKG  Sinus bradycardia  with no ST segment elevation or other apparent arrhythmia.     PROCEDURES:  Critical Care performed: No  Procedures   MEDICATIONS ORDERED IN ED: Medications  sodium chloride 0.9 % bolus 1,000 mL (0 mLs Intravenous Stopped 03/26/23 2059)     IMPRESSION / MDM / ASSESSMENT AND PLAN / ED COURSE  I reviewed the triage vital signs and the nursing notes.                              Assessment and plan: Dizziness 42 year old male presents to the emergency department with dizziness for the past 2 to 3 days.  Patient was hypertensive at triage but vital signs were otherwise reassuring.  On exam, patient was alert and nontoxic-appearing.  He had no perceived neurodeficits on exam and was alert and providing his own historical information.  He had no reproducible dizziness when moving from a supine to a sitting position.  Differential diagnosis includes electrolyte abnormality, arrhythmia, benign  positional vertigo, dehydration...  CBC and BMP reassuring.  EKG indicates sinus bradycardia with no ST segment elevation or other apparent arrhythmia.  Patient stated that he felt significantly improved after normal saline bolus and requested work note and discharge.  All patient questions were answered.     FINAL CLINICAL IMPRESSION(S) / ED DIAGNOSES   Final diagnoses:  Dizziness     Rx / DC Orders   ED Discharge Orders     None        Note:  This document was prepared using Dragon voice recognition software and may include unintentional dictation errors.   Pia Mau [place], PA-C 03/26/23 2310    Willy Eddy, MD 03/27/23 [phone]

## 2023-03-26 NOTE — ED Triage Notes (Addendum)
Pt sts that he has been getting dizzy for the last three days. Pt sts that when he bends over and stands back up he gets dizzy. 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

## 2023-03-26 NOTE — ED Notes (Signed)
Patient is sitting in lobby, watching his phone. No acute distress noted.

## 2024-02-29 ENCOUNTER — Ambulatory Visit: Payer: Self-pay | Admitting: Family Medicine

## 2024-02-29 DIAGNOSIS — Z202 Contact with and (suspected) exposure to infections with a predominantly sexual mode of transmission: Secondary | ICD-10-CM

## 2024-02-29 MED ORDER — METRONIDAZOLE 500 MG PO TABS: 2000.0000 mg | ORAL_TABLET | Freq: Once | ORAL | Status: AC

## 2024-02-29 NOTE — Progress Notes (Signed)
 1. Exposure to trichomonas (Primary)  - metroNIDAZOLE (FLAGYL) 500 MG tablet; Take 4 tablets (2,000 mg total) by mouth once for 1 dose.   Seen and treated by RN only  Attestation of [facility]: Evaluation and management procedures were performed by the Registered Nurse under [facility] Department standing orders.  RN independently saw the patient and provided medication as a contact for them without medical exam. I have reviewed the RN's note and chart, and I agree with ordered medication.   Earleen Glazier, [place]

## 2024-02-29 NOTE — Progress Notes (Signed)
 Pt is here STD treatment and report contact to Tr[facility].The patient was dispensed Metronidazole 2g once today. I provided counseling today regarding the medication. We discussed the medication, the side effects and when to call clinic. Condoms declined. Patient given the opportunity to ask questions for any clarification. Austine Lefort, RN.
